# Patient Record
Sex: Female | Born: 1965 | Race: Black or African American | Hispanic: No | Marital: Single | Smoking: Never smoker
Health system: Southern US, Community
[De-identification: ages and names within clinical notes are randomized; demographics above are authoritative.]

## PROBLEM LIST (undated history)

## (undated) DIAGNOSIS — K589 Irritable bowel syndrome without diarrhea: Secondary | ICD-10-CM

## (undated) DIAGNOSIS — I10 Essential (primary) hypertension: Secondary | ICD-10-CM

## (undated) DIAGNOSIS — K219 Gastro-esophageal reflux disease without esophagitis: Secondary | ICD-10-CM

## (undated) DIAGNOSIS — I341 Nonrheumatic mitral (valve) prolapse: Secondary | ICD-10-CM

## (undated) DIAGNOSIS — J45909 Unspecified asthma, uncomplicated: Secondary | ICD-10-CM

## (undated) HISTORY — PX: APPENDECTOMY: SHX54

## (undated) HISTORY — PX: BREAST BIOPSY: SHX20

## (undated) HISTORY — PX: CHOLECYSTECTOMY: SHX55

## (undated) HISTORY — DX: Gastro-esophageal reflux disease without esophagitis: K21.9

## (undated) HISTORY — PX: BREAST SURGERY: SHX581

## (undated) HISTORY — PX: ABDOMINAL HYSTERECTOMY: SHX81

---

## 2000-12-11 HISTORY — PX: TONSILLECTOMY: SUR1361

## 2004-12-11 HISTORY — PX: REDUCTION MAMMAPLASTY: SUR839

## 2018-05-16 ENCOUNTER — Emergency Department (HOSPITAL_COMMUNITY)
Admission: EM | Admit: 2018-05-16 | Discharge: 2018-05-16 | Disposition: A | Discharge status: Home / Self Care | Payer: 59 | Attending: Emergency Medicine | Admitting: Emergency Medicine

## 2018-05-16 ENCOUNTER — Emergency Department (HOSPITAL_COMMUNITY): Payer: 59

## 2018-05-16 ENCOUNTER — Other Ambulatory Visit: Payer: Self-pay

## 2018-05-16 ENCOUNTER — Encounter (HOSPITAL_COMMUNITY): Payer: Self-pay | Admitting: Emergency Medicine

## 2018-05-16 DIAGNOSIS — I10 Essential (primary) hypertension: Secondary | ICD-10-CM | POA: Insufficient documentation

## 2018-05-16 DIAGNOSIS — M5412 Radiculopathy, cervical region: Secondary | ICD-10-CM | POA: Diagnosis not present

## 2018-05-16 DIAGNOSIS — M4802 Spinal stenosis, cervical region: Secondary | ICD-10-CM | POA: Insufficient documentation

## 2018-05-16 DIAGNOSIS — J45909 Unspecified asthma, uncomplicated: Secondary | ICD-10-CM | POA: Diagnosis not present

## 2018-05-16 DIAGNOSIS — M542 Cervicalgia: Secondary | ICD-10-CM | POA: Diagnosis present

## 2018-05-16 HISTORY — DX: Irritable bowel syndrome without diarrhea: K58.9

## 2018-05-16 HISTORY — DX: Essential (primary) hypertension: I10

## 2018-05-16 HISTORY — DX: Nonrheumatic mitral (valve) prolapse: I34.1

## 2018-05-16 HISTORY — DX: Unspecified asthma, uncomplicated: J45.909

## 2018-05-16 MED ORDER — METHOCARBAMOL 500 MG PO TABS: 500.0000 mg | ORAL_TABLET | Freq: Three times a day (TID) | ORAL | 0 refills | Status: AC

## 2018-05-16 MED ORDER — PREDNISONE 10 MG PO TABS: 40.0000 mg | ORAL_TABLET | Freq: Every day | ORAL | 0 refills | Status: AC

## 2018-05-16 NOTE — ED Provider Notes (Signed)
[facility] Provider Note   CSN: [id number] Arrival date & time: 05/16/18  1204     History   Chief Complaint Chief Complaint  Patient presents with  . Arm Injury    HPI Amanda [name] is a 52 y.o. female with h/o HTN and cervical radiculopathy and foraminal stenosis s/p discectomy and fusion (Dr. Yvone NeuPatton in [place]) 2017 here for gradual onset worsening right sided neck pain x 2 weeks, worsening over last 2 days. Described as sharp, shooting. Moderate in severity. Radiates to right trapezius, right shoulder and right 4th and 5th fingers tips. Associated with paresthesias and intermittent decreased sensation to 4th and 5th fingers only. Aggravated by palpation, right neck bend and rotation and letting her right hand hang to the side for a long period of time. Has tried OTC NSAIDs without relief.   No recent trauma/falls, overuse activities. She denies fevers, chills, rash, neck rigidity, headache, vision changes, unilateral weakness, IVDU. Recently relocated from [place].   HPI  Past Medical History:  Diagnosis Date  . Asthma   . Hypertension   . Irritable bowel   . Mitral valve prolapse     There are no active problems to display for this patient.   History reviewed. No pertinent surgical history.   OB History   None      Home Medications    Prior to Admission medications   Medication Sig Start Date End Date Taking? Authorizing Provider  methocarbamol (ROBAXIN) 500 MG tablet Take 1 tablet (500 mg total) by mouth 3 (three) times daily for 5 days. 05/16/18 05/21/18  Liberty HandyGibbons, Claudia J, PA-C  predniSONE (DELTASONE) 10 MG tablet Take 4 tablets (40 mg total) by mouth daily for 5 days. 05/16/18 05/21/18  Liberty HandyGibbons, Claudia J, PA-C    Family History No family history on file.  Social History Social History   Tobacco Use  . Smoking status: Not on file  Substance Use Topics  . Alcohol use: Not on file  . Drug use: Not on file      Allergies   Vancomycin   Review of Systems Review of Systems  Musculoskeletal: Positive for myalgias and neck pain.  Neurological: Positive for numbness.       Paresthesias   All other systems reviewed and are negative.    Physical Exam Updated Vital Signs BP (!) 121/98 (BP Location: Left Arm)   Pulse 73   Temp 98.4 F (36.9 C) (Oral)   Resp 16   LMP  (LMP Unknown)   SpO2 100%   Physical Exam  Constitutional: She is oriented to person, place, and time. She appears well-developed and well-nourished.  Non-toxic appearance.  HENT:  Head: Normocephalic.  Right Ear: External ear normal.  Left Ear: External ear normal.  Nose: Nose normal.  Atraumatic. No facial or scalp tenderness, asymmetry.   Eyes: Conjunctivae and EOM are normal.  Neck: Muscular tenderness present.  Able to touch chin to chest without rigidity or pain.   Cardiovascular: Normal rate and normal heart sounds.  Pulmonary/Chest: Effort normal and breath sounds normal.  Musculoskeletal: Normal range of motion.       Cervical back: She exhibits tenderness.       Back:  C-spine: no midline tenderness. Mild R sided muscular tenderness from occiput to top of trapezius. Full AROM but pain reproduced with right neck bend and rotation. Positive spurling's. Negative adson's. No overlaying rash. 2+ carotids. Trachea midline.  Right upper extremity: 5/5 strength with finger abduction, adduction,  flexion and extension, hand grip, wrist F/E, elbow F/E and shoulder F/E/Ab/Ad bilaterally.  Painless PROM of shoulders without pain. positive phalen's (R) with paresthesias to 4th and 5th fingers.   Thoracic spine: no midline tenderness or paraspinal muscle tenderness.   Neurological: She is alert and oriented to person, place, and time.  Subjective decreased sensation to right 4th and 5th fingers to sharp stimulus however 2 point discrimination intact bilaterally.   Skin: Skin is warm and dry. Capillary refill takes less  than 2 seconds.  No overlaying rash to neck, back or UE   Psychiatric: Her behavior is normal. Thought content normal.     ED Treatments / Results  Labs (all labs ordered are listed, but only abnormal results are displayed) Labs Reviewed - No data to display  EKG None  Radiology Dg Cervical Spine Complete  Result Date: 05/16/2018 CLINICAL DATA:  Acute neck pain radiating down RIGHT arm for several days. No known injury. Initial encounter. EXAM: CERVICAL SPINE - COMPLETE 4+ VIEW COMPARISON:  None. FINDINGS: Anterior/interbody fusion from C4-C7 identified. No acute fracture or subluxation noted. No prevertebral soft tissue swelling. Mild degenerative disc disease at C3-4 and C7-T1 identified. Mild multilevel facet arthropathy noted. Bony foraminal narrowing is identified on the RIGHT at C4-5 and on the LEFT at C6-7. No focal bony lesions are present. IMPRESSION: 1. No acute abnormality 2. Anterior/interbody fusion from C4-C7 with degenerative changes as described. Bony foraminal narrowing on the RIGHT at C4-5 and on the LEFT at C6-7. Electronically Signed   By: Harmon Pier M.D.   On: 05/16/2018 14:39    Procedures Procedures (including critical care time)  Medications Ordered in ED Medications - No data to display   Initial Impression / Assessment and Plan / ED Course  I have reviewed the triage vital signs and the nursing notes.  Pertinent labs & imaging results that were available during my care of the patient were reviewed by me and considered in my medical decision making (see chart for details).  Clinical Course as of May 17 1000  Thu May 16, 2018  1525 FINDINGS: Anterior/interbody fusion from C4-C7 identified.  No acute fracture or subluxation noted. No prevertebral soft tissue swelling.  Mild degenerative disc disease at C3-4 and C7-T1 identified.  Mild multilevel facet arthropathy noted.  Bony foraminal narrowing is identified on the RIGHT at C4-5 and on the LEFT  at C6-7.  No focal bony lesions are present.  DG Cervical Spine Complete [CG]    Clinical Course User Index [CG] Liberty Handy, PA-C   52 y.o. yo female with pertinent pmh of cervical radiculopathy and foraminal stenosis s/p fusion in 2017 presents with neck pain x 2 weeks. She has reproducible TTP to right posterior neck pain associated with paresthesias down to fingers on exam.  Positive Spurling's and phalen's.  No traumatic antecedent event.  No focal weakness to LUE.  No Lhermitte's phenomenon, no gait disturbances, weakness, muscle atrophy.  No recent fevers, chills, unexplained weight loss, immunosuppression, cancer or IVDU.  Low suspicion for dissection, stroke, meningitis, epidural abscess, bony fracture based on history and exam. Triage ordered imaging which was reviewed showing mild DDD and foraminal stenosis of cervical spine.  I suspect this is contributing to radicular symptoms.  Will discharge with oral analgesics, prednisone and muscle relaxers.  Advised patient to avoid aggravating activities until symptoms start to improve.  Educated patient on red flag symptoms that would warrant return to ED, patient verbalized understanding.  I have  provided pt with contact information for on call neurosurgery group to f/u within 1 week if symptoms not improving.    Final Clinical Impressions(s) / ED Diagnoses   Final diagnoses:  Foraminal stenosis of cervical region  Cervical radiculopathy    ED Discharge Orders        Ordered    predniSONE (DELTASONE) 10 MG tablet  Daily     05/16/18 1526    methocarbamol (ROBAXIN) 500 MG tablet  3 times daily     05/16/18 1526       Liberty Handy, [place] 05/17/18 1001    Lorre Nick, MD 05/18/18 1153

## 2018-05-16 NOTE — ED Triage Notes (Signed)
Patient complains of pain and tingling radiating from upper back down both arms x2 weeks. Patient has taken tylenol and aleve with some relief. Patient alert, oriented, and ambulating independently with steady gait.

## 2018-05-16 NOTE — ED Provider Notes (Signed)
Patient placed in Quick Look pathway, seen and evaluated   Chief Complaint: pain and tingling starting in back and going to the right arm  HPI:  Pt presents with 2 weeks of pain that starts in her neck and back and radiates down the right arm causing pain and tingling down into right arm, occasional pain and tingling on the left, she reports some subjective weakness and hypersensitivity, No known injury to neck or back, hx of previous neck surgery.  ROS: + back pain, neck pain, arm pain, tingling, - fevers, chest pain, SOB, redness or swelling  Physical Exam:   Gen: No distress  Neuro: Awake and Alert  Skin: Warm    Focused Exam: Strength 5/5 in bilateral UE, normal sensation, no erythema, or swelling. Symptoms reproduced with Spurling maneuver   Initiation of care has begun. The patient has been counseled on the process, plan, and necessity for staying for the completion/evaluation, and the remainder of the medical screening examination    Legrand RamsFord, Naleyah Ohlinger N, PA-C 05/16/18 1325    Eber HongMiller, Brian, MD 05/17/18 [phone]

## 2018-05-16 NOTE — Discharge Instructions (Addendum)
Your x-ray shows narrowing of the bones in your neck that are probably causing nerve inflammation.  This is called foraminal stenosis.  Initial treatment is symptomatic.  If pain is refractory you need to follow-up with neurosurgery.  We will treat your inflammation with the following medication regimen: Prednisone 40 mg daily x 5 days Methocarbamol (robaxin) 500 mg every 8 hours x 5 days Ibuprofen 600 mg + acetaminophen 1000 mg every 8 hours for the next 5 days Heating pad as needed Over the counter lidocaine patches (salonpas) can be helpful  Avoid any exacerbating activities for the next 48 hours.  After 48 hours, start doing light back range of motion exercises and walking to avoid worsening back stiffness.   Return for fevers, chills, abdominal pain, changes in bowel movement, urinary symptoms, groin numbness, loss of bladder or bowel control, numbness weakness or heaviness to your extremities, rash.

## 2018-06-20 ENCOUNTER — Other Ambulatory Visit: Payer: Self-pay | Admitting: Physician Assistant

## 2018-06-20 DIAGNOSIS — M5412 Radiculopathy, cervical region: Secondary | ICD-10-CM

## 2018-07-03 ENCOUNTER — Ambulatory Visit
Admission: RE | Admit: 2018-07-03 | Discharge: 2018-07-03 | Disposition: A | Discharge status: Home / Self Care | Payer: 59 | Source: Ambulatory Visit | Attending: Physician Assistant | Admitting: Physician Assistant

## 2018-07-03 DIAGNOSIS — M5412 Radiculopathy, cervical region: Secondary | ICD-10-CM

## 2018-07-03 MED ORDER — GADOBENATE DIMEGLUMINE 529 MG/ML IV SOLN
20.0000 mL | Freq: Once | INTRAVENOUS | Status: AC | PRN
  Administered 2018-07-03: 20 mL via INTRAVENOUS

## 2018-10-31 ENCOUNTER — Ambulatory Visit: Payer: 59 | Admitting: Family Medicine

## 2018-10-31 ENCOUNTER — Encounter: Payer: Self-pay | Admitting: Family Medicine

## 2018-10-31 ENCOUNTER — Other Ambulatory Visit: Payer: Self-pay | Admitting: Family Medicine

## 2018-10-31 VITALS — BP 110/88 | HR 96 | Temp 97.9°F | Ht 67.0 in | Wt 216.0 lb

## 2018-10-31 DIAGNOSIS — J4520 Mild intermittent asthma, uncomplicated: Secondary | ICD-10-CM

## 2018-10-31 DIAGNOSIS — K589 Irritable bowel syndrome without diarrhea: Secondary | ICD-10-CM

## 2018-10-31 DIAGNOSIS — M79605 Pain in left leg: Secondary | ICD-10-CM

## 2018-10-31 DIAGNOSIS — I10 Essential (primary) hypertension: Secondary | ICD-10-CM

## 2018-10-31 DIAGNOSIS — Z7689 Persons encountering health services in other specified circumstances: Secondary | ICD-10-CM

## 2018-10-31 DIAGNOSIS — K219 Gastro-esophageal reflux disease without esophagitis: Secondary | ICD-10-CM | POA: Diagnosis not present

## 2018-10-31 NOTE — Progress Notes (Signed)
Patient presents to clinic today to establish care.  SUBJECTIVE: PMH: Pt is a 52 yo female with pmh sig for HTN, GERD, IBS, and asthma.  Pt previously seen in [place] by Dr. Arthur Holms.  HTN: -taking lisinopril10-12.5 mg daily -walking for exercise  GERD: -taking prilosec  IBS: -may have taken Bentyl.  Pt cannot recall med, thinks started with an "O" or a "B" -has seen GI in the past  Asthma: -taking singulair -infrequent symptoms  LLE pain: -stepped down wrong while going down stairs -6 days ago -L lateral knee and calf pain -tried ice, heat, elevation, left over flexeril and motrin -improving but sore -denies calf or knee edema or erythema Allergies: Vancomycin--hives, red man syndrome, anaphylaxis after MRSA infection.  Past Medical History:  Diagnosis Date  . Asthma   . Hypertension   . Irritable bowel   . Mitral valve prolapse     History reviewed. No pertinent surgical history.  Current Outpatient Medications on File Prior to Visit  Medication Sig Dispense Refill  . lisinopril-hydrochlorothiazide (PRINZIDE,ZESTORETIC) 10-12.5 MG tablet Take 1 tablet by mouth daily.    . montelukast (SINGULAIR) 10 MG tablet Take by mouth at bedtime.    Marland Kitchen omeprazole (PRILOSEC) 20 MG capsule Take 20 mg by mouth daily.     No current facility-administered medications on file prior to visit.     Allergies  Allergen Reactions  . Vancomycin Hives and Rash    History reviewed. No pertinent family history.  Social History   Socioeconomic History  . Marital status: Single    Spouse name: Not on file  . Number of children: Not on file  . Years of education: Not on file  . Highest education level: Not on file  Occupational History  . Not on file  Social Needs  . Financial resource strain: Not on file  . Food insecurity:    Worry: Not on file    Inability: Not on file  . Transportation needs:    Medical: Not on file    Non-medical: Not on file  Tobacco  Use  . Smoking status: Not on file  Substance and Sexual Activity  . Alcohol use: Not on file  . Drug use: Not on file  . Sexual activity: Not on file  Lifestyle  . Physical activity:    Days per week: Not on file    Minutes per session: Not on file  . Stress: Not on file  Relationships  . Social connections:    Talks on phone: Not on file    Gets together: Not on file    Attends religious service: Not on file    Active member of club or organization: Not on file    Attends meetings of clubs or organizations: Not on file    Relationship status: Not on file  . Intimate partner violence:    Fear of current or ex partner: Not on file    Emotionally abused: Not on file    Physically abused: Not on file    Forced sexual activity: Not on file  Other Topics Concern  . Not on file  Social History Narrative  . Not on file    ROS General: Denies fever, chills, night sweats, changes in weight, changes in appetite HEENT: Denies headaches, ear pain, changes in vision, rhinorrhea, sore throat CV: Denies CP, palpitations, SOB, orthopnea Pulm: Denies SOB, cough, wheezing GI: Denies abdominal pain, nausea, vomiting, diarrhea, constipation  +reflux, h/o gerd GU: Denies dysuria,  hematuria, frequency, vaginal discharge Msk: Denies muscle cramps, joint pains  +: LLE pain Neuro: Denies weakness, numbness, tingling Skin: Denies rashes, bruising Psych: Denies depression, anxiety, hallucinations   BP 110/88 (BP Location: Left Arm, Patient Position: Sitting, Cuff Size: Normal)   Pulse 96   Temp 97.9 F (36.6 C) (Oral)   Ht 5\' 7"  (1.702 m)   Wt 216 lb (98 kg)   LMP  (LMP Unknown)   SpO2 96%   BMI 33.83 kg/m   Physical Exam Gen. Pleasant, well developed, well-nourished, in NAD HEENT - [place]/AT, PERRL, no scleral icterus, no nasal drainage, pharynx without erythema or exudate. Lungs: no use of accessory muscles, CTAB, no wheezes, rales or rhonchi Cardiovascular: RRR, No r/g/m, no peripheral  edema Musculoskeletal: Mild TTP of L lateral leg inferior to knee, no TTP in calf.  No deformities, moves all four extremities, no cyanosis or clubbing, normal tone Neuro:  A&Ox3, CN II-XII intact, normal gait  No results found for this or any previous visit (from the past 2160 hour(s)).  Assessment/Plan: Essential hypertension -controlled  -continue lisinopril-HCTZ 10-12.5 mg -continue lifestyle modifications  Gastroesophageal reflux disease, esophagitis presence not specified -continue prilosec -avoid foods known to cause problems  Irritable bowel syndrome, unspecified type -discussed low FODMAP diet -consider referral to GI  Mild intermittent asthma without complication -continue singulair  -consider albuterol prn  LLE pain -continue NSAIDs, elevated, ice  Encounter to establish care -We reviewed the PMH, PSH, FH, SH, Meds and Allergies. -We provided refills for any medications we will prescribe as needed. -We addressed current concerns per orders and patient instructions. -We have asked for records for pertinent exams, studies, vaccines and notes from previous providers. -We have advised patient to follow up per instructions below.  F/u prn  Abbe AmsterdamShannon Aleksis Jiggetts, MD

## 2018-10-31 NOTE — Telephone Encounter (Signed)
Dr. Salomon FickBanks, you seen this pt and established care.  She is asking for refills of her medications.  Please advise.

## 2018-10-31 NOTE — Patient Instructions (Signed)
Diet for Irritable Bowel Syndrome When you have irritable bowel syndrome (IBS), the foods you eat and your eating habits are very important. IBS may cause various symptoms, such as abdominal pain, constipation, or diarrhea. Choosing the right foods can help ease discomfort caused by these symptoms. Work with your health care provider and dietitian to find the best eating plan to help control your symptoms. What general guidelines do I need to follow?  Keep a food diary. This will help you identify foods that cause symptoms. Write down: ? What you eat and when. ? What symptoms you have. ? When symptoms occur in relation to your meals.  Avoid foods that cause symptoms. Talk with your dietitian about other ways to get the same nutrients that are in these foods.  Eat more foods that contain fiber. Take a fiber supplement if directed by your dietitian.  Eat your meals slowly, in a relaxed setting.  Aim to eat 5-6 small meals per day. Do not skip meals.  Drink enough fluids to keep your urine clear or pale yellow.  Ask your health care provider if you should take an over-the-counter probiotic during flare-ups to help restore healthy gut bacteria.  If you have cramping or diarrhea, try making your meals low in fat and high in carbohydrates. Examples of carbohydrates are pasta, rice, whole grain breads and cereals, fruits, and vegetables.  If dairy products cause your symptoms to flare up, try eating less of them. You might be able to handle yogurt better than other dairy products because it contains bacteria that help with digestion. What foods are not recommended? The following are some foods and drinks that may worsen your symptoms:  Fatty foods, such as [place] fries.  Milk products, such as cheese or ice cream.  Chocolate.  Alcohol.  Products with caffeine, such as coffee.  Carbonated drinks, such as soda.  The items listed above may not be a complete list of foods and beverages to  avoid. Contact your dietitian for more information. What foods are good sources of fiber? Your health care provider or dietitian may recommend that you eat more foods that contain fiber. Fiber can help reduce constipation and other IBS symptoms. Add foods with fiber to your diet a little at a time so that your body can get used to them. Too much fiber at once might cause gas and swelling of your abdomen. The following are some foods that are good sources of fiber:  Apples.  Peaches.  Pears.  Berries.  Figs.  Broccoli (raw).  Cabbage.  Carrots.  Raw peas.  Kidney beans.  Lima beans.  Whole grain bread.  Whole grain cereal.  Where to find more information: [employer] for Functional Gastrointestinal Disorders: www.iffgd.[employer] of Diabetes and Digestive and Kidney Diseases: http://norris-lawson.com/.aspx This information is not intended to replace advice given to you by your health care provider. Make sure you discuss any questions you have with your health care provider. Document Released: 02/17/2004 Document Revised: 05/04/2016 Document Reviewed: 02/27/2014 Elsevier Interactive Patient Education  2018 [employer].  Food Choices for Gastroesophageal Reflux Disease, Adult When you have gastroesophageal reflux disease (GERD), the foods you eat and your eating habits are very important. Choosing the right foods can help ease your discomfort. What guidelines do I need to follow?  Choose fruits, vegetables, whole grains, and low-fat dairy products.  Choose low-fat meat, fish, and poultry.  Limit fats such as oils, salad dressings, butter, nuts, and avocado.  Keep a food  diary. This helps you identify foods that cause symptoms.  Avoid foods that cause symptoms. These may be different for everyone.  Eat small meals often instead of 3 large meals a day.  Eat your meals slowly, in a  place where you are relaxed.  Limit fried foods.  Cook foods using methods other than frying.  Avoid drinking alcohol.  Avoid drinking large amounts of liquids with your meals.  Avoid bending over or lying down until 2-3 hours after eating. What foods are not recommended? These are some foods and drinks that may make your symptoms worse: Vegetables Tomatoes. Tomato juice. Tomato and spaghetti sauce. Chili peppers. Onion and garlic. Horseradish. Fruits Oranges, grapefruit, and lemon (fruit and juice). Meats High-fat meats, fish, and poultry. This includes hot dogs, ribs, ham, sausage, salami, and bacon. Dairy Whole milk and chocolate milk. Sour cream. Cream. Butter. Ice cream. Cream cheese. Drinks Coffee and tea. Bubbly (carbonated) drinks or energy drinks. Condiments Hot sauce. Barbecue sauce. Sweets/Desserts Chocolate and cocoa. Donuts. Peppermint and spearmint. Fats and Oils High-fat foods. This includes [place] fries and potato chips. Other Vinegar. Strong spices. This includes black pepper, white pepper, red pepper, cayenne, curry powder, cloves, ginger, and chili powder. The items listed above may not be a complete list of foods and drinks to avoid. Contact your dietitian for more information. This information is not intended to replace advice given to you by your health care provider. Make sure you discuss any questions you have with your health care provider. Document Released: 05/28/2012 Document Revised: 05/04/2016 Document Reviewed: 10/01/2013 Elsevier Interactive Patient Education  2017 [employer] Inc.  Managing Your Hypertension Hypertension is commonly called high blood pressure. This is when the force of your blood pressing against the walls of your arteries is too strong. Arteries are blood vessels that carry blood from your heart throughout your body. Hypertension forces the heart to work harder to pump blood, and may cause the arteries to become narrow or stiff.  Having untreated or uncontrolled hypertension can cause heart attack, stroke, kidney disease, and other problems. What are blood pressure readings? A blood pressure reading consists of a higher number over a lower number. Ideally, your blood pressure should be below 120/80. The first ("top") number is called the systolic pressure. It is a measure of the pressure in your arteries as your heart beats. The second ("bottom") number is called the diastolic pressure. It is a measure of the pressure in your arteries as the heart relaxes. What does my blood pressure reading mean? Blood pressure is classified into four stages. Based on your blood pressure reading, your health care provider may use the following stages to determine what type of treatment you need, if any. Systolic pressure and diastolic pressure are measured in a unit called mm Hg. Normal  Systolic pressure: below 120.  Diastolic pressure: below 80. Elevated  Systolic pressure: 120-129.  Diastolic pressure: below 80. Hypertension stage 1  Systolic pressure: 130-139.  Diastolic pressure: 80-89. Hypertension stage 2  Systolic pressure: 140 or above.  Diastolic pressure: 90 or above. What health risks are associated with hypertension? Managing your hypertension is an important responsibility. Uncontrolled hypertension can lead to:  A heart attack.  A stroke.  A weakened blood vessel (aneurysm).  Heart failure.  Kidney damage.  Eye damage.  Metabolic syndrome.  Memory and concentration problems.  What changes can I make to manage my hypertension? Hypertension can be managed by making lifestyle changes and possibly by taking medicines. Your health care  provider will help you make a plan to bring your blood pressure within a normal range. Eating and drinking  Eat a diet that is high in fiber and potassium, and low in salt (sodium), added sugar, and fat. An example eating plan is called the DASH (Dietary Approaches to  Stop Hypertension) diet. To eat this way: ? Eat plenty of fresh fruits and vegetables. Try to fill half of your plate at each meal with fruits and vegetables. ? Eat whole grains, such as whole wheat pasta, brown rice, or whole grain bread. Fill about one quarter of your plate with whole grains. ? Eat low-fat diary products. ? Avoid fatty cuts of meat, processed or cured meats, and poultry with skin. Fill about one quarter of your plate with lean proteins such as fish, chicken without skin, beans, eggs, and tofu. ? Avoid premade and processed foods. These tend to be higher in sodium, added sugar, and fat.  Reduce your daily sodium intake. Most people with hypertension should eat less than 1,500 mg of sodium a day.  Limit alcohol intake to no more than 1 drink a day for nonpregnant women and 2 drinks a day for men. One drink equals 12 oz of beer, 5 oz of wine, or 1 oz of hard liquor. Lifestyle  Work with your health care provider to maintain a healthy body weight, or to lose weight. Ask what an ideal weight is for you.  Get at least 30 minutes of exercise that causes your heart to beat faster (aerobic exercise) most days of the week. Activities may include walking, swimming, or biking.  Include exercise to strengthen your muscles (resistance exercise), such as weight lifting, as part of your weekly exercise routine. Try to do these types of exercises for 30 minutes at least 3 days a week.  Do not use any products that contain nicotine or tobacco, such as cigarettes and e-cigarettes. If you need help quitting, ask your health care provider.  Control any long-term (chronic) conditions you have, such as high cholesterol or diabetes. Monitoring  Monitor your blood pressure at home as told by your health care provider. Your personal target blood pressure may vary depending on your medical conditions, your age, and other factors.  Have your blood pressure checked regularly, as often as told by your  health care provider. Working with your health care provider  Review all the medicines you take with your health care provider because there may be side effects or interactions.  Talk with your health care provider about your diet, exercise habits, and other lifestyle factors that may be contributing to hypertension.  Visit your health care provider regularly. Your health care provider can help you create and adjust your plan for managing hypertension. Will I need medicine to control my blood pressure? Your health care provider may prescribe medicine if lifestyle changes are not enough to get your blood pressure under control, and if:  Your systolic blood pressure is 130 or higher.  Your diastolic blood pressure is 80 or higher.  Take medicines only as told by your health care provider. Follow the directions carefully. Blood pressure medicines must be taken as prescribed. The medicine does not work as well when you skip doses. Skipping doses also puts you at risk for problems. Contact a health care provider if:  You think you are having a reaction to medicines you have taken.  You have repeated (recurrent) headaches.  You feel dizzy.  You have swelling in your ankles.  You  have trouble with your vision. Get help right away if:  You develop a severe headache or confusion.  You have unusual weakness or numbness, or you feel faint.  You have severe pain in your chest or abdomen.  You vomit repeatedly.  You have trouble breathing. Summary  Hypertension is when the force of blood pumping through your arteries is too strong. If this condition is not controlled, it may put you at risk for serious complications.  Your personal target blood pressure may vary depending on your medical conditions, your age, and other factors. For most people, a normal blood pressure is less than 120/80.  Hypertension is managed by lifestyle changes, medicines, or both. Lifestyle changes include  weight loss, eating a healthy, low-sodium diet, exercising more, and limiting alcohol. This information is not intended to replace advice given to you by your health care provider. Make sure you discuss any questions you have with your health care provider. Document Released: 08/21/2012 Document Revised: 10/25/2016 Document Reviewed: 10/25/2016 Elsevier Interactive Patient Education  [employer].

## 2018-11-01 ENCOUNTER — Encounter: Payer: Self-pay | Admitting: Family Medicine

## 2018-11-01 MED ORDER — OMEPRAZOLE 20 MG PO CPDR: 20.0000 mg | DELAYED_RELEASE_CAPSULE | Freq: Every day | ORAL | 3 refills | Status: DC

## 2018-11-01 MED ORDER — MONTELUKAST SODIUM 10 MG PO TABS: 10.0000 mg | ORAL_TABLET | Freq: Every day | ORAL | 3 refills | Status: DC

## 2018-11-01 MED ORDER — LISINOPRIL-HYDROCHLOROTHIAZIDE 10-12.5 MG PO TABS: 1.0000 | ORAL_TABLET | Freq: Every day | ORAL | 3 refills | Status: DC

## 2018-11-03 ENCOUNTER — Encounter: Payer: Self-pay | Admitting: Family Medicine

## 2018-11-03 DIAGNOSIS — K589 Irritable bowel syndrome without diarrhea: Secondary | ICD-10-CM | POA: Insufficient documentation

## 2018-11-03 DIAGNOSIS — J4520 Mild intermittent asthma, uncomplicated: Secondary | ICD-10-CM | POA: Insufficient documentation

## 2018-11-03 DIAGNOSIS — I10 Essential (primary) hypertension: Secondary | ICD-10-CM | POA: Insufficient documentation

## 2018-11-03 DIAGNOSIS — K219 Gastro-esophageal reflux disease without esophagitis: Secondary | ICD-10-CM | POA: Insufficient documentation

## 2019-02-03 ENCOUNTER — Encounter: Payer: Self-pay | Admitting: Family Medicine

## 2019-02-03 ENCOUNTER — Ambulatory Visit (INDEPENDENT_AMBULATORY_CARE_PROVIDER_SITE_OTHER): Payer: 59 | Admitting: Family Medicine

## 2019-02-03 VITALS — BP 110/78 | HR 82 | Temp 97.9°F | Wt 222.0 lb

## 2019-02-03 DIAGNOSIS — I10 Essential (primary) hypertension: Secondary | ICD-10-CM

## 2019-02-03 DIAGNOSIS — Z0000 Encounter for general adult medical examination without abnormal findings: Secondary | ICD-10-CM

## 2019-02-03 DIAGNOSIS — J302 Other seasonal allergic rhinitis: Secondary | ICD-10-CM

## 2019-02-03 DIAGNOSIS — Z131 Encounter for screening for diabetes mellitus: Secondary | ICD-10-CM | POA: Diagnosis not present

## 2019-02-03 DIAGNOSIS — Z13220 Encounter for screening for lipoid disorders: Secondary | ICD-10-CM | POA: Diagnosis not present

## 2019-02-03 DIAGNOSIS — Z1211 Encounter for screening for malignant neoplasm of colon: Secondary | ICD-10-CM

## 2019-02-03 LAB — LIPID PANEL
CHOL/HDL RATIO: 4
Cholesterol: 206 mg/dL — ABNORMAL HIGH (ref 0–200)
HDL: 47.3 mg/dL (ref >39.00)
LDL Cholesterol: 133 mg/dL — ABNORMAL HIGH (ref 0–99)
NONHDL: 158.64
Triglycerides: 127 mg/dL (ref 0.0–149.0)
VLDL: 25.4 mg/dL (ref 0.0–40.0)

## 2019-02-03 LAB — BASIC METABOLIC PANEL
BUN: 16 mg/dL (ref 6–23)
CALCIUM: 9.8 mg/dL (ref 8.4–10.5)
CO2: 28 mEq/L (ref 19–32)
CREATININE: 1.05 mg/dL (ref 0.40–1.20)
Chloride: 103 mEq/L (ref 96–112)
GFR: 66.37 mL/min (ref >60.00)
GLUCOSE: 128 mg/dL — AB (ref 70–99)
POTASSIUM: 4 meq/L (ref 3.5–5.1)
Sodium: 140 mEq/L (ref 135–145)

## 2019-02-03 LAB — CBC WITH DIFFERENTIAL/PLATELET
BASOS PCT: 1.1 % (ref 0.0–3.0)
Basophils Absolute: 0.1 10*3/uL (ref 0.0–0.1)
EOS PCT: 4.9 % (ref 0.0–5.0)
Eosinophils Absolute: 0.3 10*3/uL (ref 0.0–0.7)
HEMATOCRIT: 47.1 % — AB (ref 36.0–46.0)
HEMOGLOBIN: 15.4 g/dL — AB (ref 12.0–15.0)
LYMPHS PCT: 45.5 % (ref 12.0–46.0)
Lymphs Abs: 2.3 10*3/uL (ref 0.7–4.0)
MCHC: 32.7 g/dL (ref 30.0–36.0)
MCV: 84.4 fl (ref 78.0–100.0)
MONOS PCT: 5.3 % (ref 3.0–12.0)
Monocytes Absolute: 0.3 10*3/uL (ref 0.1–1.0)
Neutro Abs: 2.2 10*3/uL (ref 1.4–7.7)
Neutrophils Relative %: 43.2 % (ref 43.0–77.0)
Platelets: 247 10*3/uL (ref 150.0–400.0)
RBC: 5.58 Mil/uL — ABNORMAL HIGH (ref 3.87–5.11)
RDW: 15.5 % (ref 11.5–15.5)
WBC: 5.1 10*3/uL (ref 4.0–10.5)

## 2019-02-03 LAB — HEMOGLOBIN A1C: Hgb A1c MFr Bld: 6.7 % — ABNORMAL HIGH (ref 4.6–6.5)

## 2019-02-03 MED ORDER — ALBUTEROL SULFATE HFA 108 (90 BASE) MCG/ACT IN AERS: 2.0000 | INHALATION_SPRAY | Freq: Four times a day (QID) | RESPIRATORY_TRACT | 5 refills | Status: DC | PRN

## 2019-02-03 MED ORDER — FLUTICASONE PROPIONATE 50 MCG/ACT NA SUSP: 1.0000 | Freq: Every day | NASAL | 1 refills | Status: DC

## 2019-02-03 NOTE — Addendum Note (Signed)
Addended by: Abbe Amsterdam R on: 02/03/2019 10:53 AM   Modules accepted: Orders

## 2019-02-03 NOTE — Patient Instructions (Signed)
Preventive Care 40-64 Years, Female Preventive care refers to lifestyle choices and visits with your health care provider that can promote health and wellness. What does preventive care include?   A yearly physical exam. This is also called an annual well check.  Dental exams once or twice a year.  Routine eye exams. Ask your health care provider how often you should have your eyes checked.  Personal lifestyle choices, including: ? Daily care of your teeth and gums. ? Regular physical activity. ? Eating a healthy diet. ? Avoiding tobacco and drug use. ? Limiting alcohol use. ? Practicing safe sex. ? Taking low-dose aspirin daily starting at age 50. ? Taking vitamin and mineral supplements as recommended by your health care provider. What happens during an annual well check? The services and screenings done by your health care provider during your annual well check will depend on your age, overall health, lifestyle risk factors, and family history of disease. Counseling Your health care provider may ask you questions about your:  Alcohol use.  Tobacco use.  Drug use.  Emotional well-being.  Home and relationship well-being.  Sexual activity.  Eating habits.  Work and work environment.  Method of birth control.  Menstrual cycle.  Pregnancy history. Screening You may have the following tests or measurements:  Height, weight, and BMI.  Blood pressure.  Lipid and cholesterol levels. These may be checked every 5 years, or more frequently if you are over 50 years old.  Skin check.  Lung cancer screening. You may have this screening every year starting at age 55 if you have a 30-pack-year history of smoking and currently smoke or have quit within the past 15 years.  Colorectal cancer screening. All adults should have this screening starting at age 50 and continuing until age 75. Your health care provider may recommend screening at age 45. You will have tests every  1-10 years, depending on your results and the type of screening test. People at increased risk should start screening at an earlier age. Screening tests may include: ? Guaiac-based fecal occult blood testing. ? Fecal immunochemical test (FIT). ? Stool DNA test. ? Virtual colonoscopy. ? Sigmoidoscopy. During this test, a flexible tube with a tiny camera (sigmoidoscope) is used to examine your rectum and lower colon. The sigmoidoscope is inserted through your anus into your rectum and lower colon. ? Colonoscopy. During this test, a long, thin, flexible tube with a tiny camera (colonoscope) is used to examine your entire colon and rectum.  Hepatitis C blood test.  Hepatitis B blood test.  Sexually transmitted disease (STD) testing.  Diabetes screening. This is done by checking your blood sugar (glucose) after you have not eaten for a while (fasting). You may have this done every 1-3 years.  Mammogram. This may be done every 1-2 years. Talk to your health care provider about when you should start having regular mammograms. This may depend on whether you have a family history of breast cancer.  BRCA-related cancer screening. This may be done if you have a family history of breast, ovarian, tubal, or peritoneal cancers.  Pelvic exam and Pap test. This may be done every 3 years starting at age 21. Starting at age 30, this may be done every 5 years if you have a Pap test in combination with an HPV test.  Bone density scan. This is done to screen for osteoporosis. You may have this scan if you are at high risk for osteoporosis. Discuss your test results, treatment options,   and if necessary, the need for more tests with your health care provider. Vaccines Your health care provider may recommend certain vaccines, such as:  Influenza vaccine. This is recommended every year.  Tetanus, diphtheria, and acellular pertussis (Tdap, Td) vaccine. You may need a Td booster every 10 years.  Varicella  vaccine. You may need this if you have not been vaccinated.  Zoster vaccine. You may need this after age 79.  Measles, mumps, and rubella (MMR) vaccine. You may need at least one dose of MMR if you were born in 1957 or later. You may also need a second dose.  Pneumococcal 13-valent conjugate (PCV13) vaccine. You may need this if you have certain conditions and were not previously vaccinated.  Pneumococcal polysaccharide (PPSV23) vaccine. You may need one or two doses if you smoke cigarettes or if you have certain conditions.  Meningococcal vaccine. You may need this if you have certain conditions.  Hepatitis A vaccine. You may need this if you have certain conditions or if you travel or work in places where you may be exposed to hepatitis A.  Hepatitis B vaccine. You may need this if you have certain conditions or if you travel or work in places where you may be exposed to hepatitis B.  Haemophilus influenzae type b (Hib) vaccine. You may need this if you have certain conditions. Talk to your health care provider about which screenings and vaccines you need and how often you need them. This information is not intended to replace advice given to you by your health care provider. Make sure you discuss any questions you have with your health care provider. Document Released: 12/24/2015 Document Revised: 01/17/2018 Document Reviewed: 09/28/2015 Elsevier Interactive Patient Education  2019 [place] [facility]  Colorectal cancer screening is a group of tests that are used to check for colorectal cancer before symptoms develop. Colorectal refers to the colon and rectum. The colon and rectum are located at the end of the digestive tract and carry bowel movements out of the body. Who should have screening? All adults starting at age 2 until age 13 should have screening. Your health care provider may recommend screening at age 1. You will have tests every 1-10 years,  depending on your results and the type of screening test. You may have screening tests starting at an earlier age, or more frequently than other people, if you have any of the following risk factors:  A personal or family history of colorectal cancer or abnormal growths (polyps).  Inflammatory bowel disease, such as ulcerative colitis or Crohn's disease.  A history of having radiation treatment to the abdomen or pelvic area for cancer.  Colorectal cancer symptoms, such as changes in bowel habits or blood in your stool.  A type of colon cancer syndrome that is passed from parent to child (hereditary), such as: ? Lynch syndrome. ? Familial adenomatous polyposis. ? Turcot syndrome. ? Peutz-Jeghers syndrome. Screening recommendations for adults who are 50-62 years old vary depending on health. How is screening done? There are several types of colorectal screening tests. You may have one or more of the following:  Guaiac-based fecal occult blood testing. For this test, a stool (feces) sample is checked for hidden (occult) blood, which could be a sign of colorectal cancer.  Fecal immunochemical test (FIT). For this test, a stool sample is checked for blood, which could be a sign of colorectal cancer.  Stool DNA test. For this test, a stool sample is checked for  blood and changes in DNA that could lead to colorectal cancer.  Sigmoidoscopy. During this test, a thin, flexible tube with a camera on the end (sigmoidoscope) is used to examine the rectum and the lower colon.  Colonoscopy. During this test, a long, flexible tube with a camera on the end (colonoscope) is used to examine the entire colon and rectum. With a colonoscopy, it is possible to take a sample of tissue (biopsy) and remove small polyps during the test.  Virtual colonoscopy. Instead of a colonoscope, this type of colonoscopy uses X-rays (CT scan) and computers to produce images of the colon and rectum. What are the benefits of  screening? Screening reduces your risk for colorectal cancer and can help identify cancer at an early stage, when the cancer can be removed or treated more easily. It is common for polyps to form in the lining of the colon, especially as you age. These polyps may be cancerous or become cancerous over time. Screening can identify these polyps. What are the risks of screening? Each screening test may have different risks.  Stool sample tests have fewer risks than other types of screening tests. However, you may need more tests to confirm results from a stool sample test.  Screening tests that involve X-rays expose you to low levels of radiation, which may slightly increase your cancer risk. The benefit of detecting cancer outweighs the slight increase in risk.  Screening tests such as sigmoidoscopy and colonoscopy may place you at risk for bleeding, intestinal damage, infection, or a reaction to medicines given during the exam. Talk with your health care provider to understand your risk for colorectal cancer and to make a screening plan that is right for you. Questions to ask your health care provider  When should I start colorectal cancer screening?  What is my risk for colorectal cancer?  How often do I need screening?  Which screening tests do I need?  How do I get my test results?  What do my results mean? Where to find more information Learn more about colorectal cancer screening from:  [employer]: www.cancer.org  [employer]: www.cancer.gov Summary  Colorectal cancer screening is a group of tests used to check for colorectal cancer before symptoms develop.  Screening reduces your risk for colorectal cancer and can help identify cancer at an early stage, when the cancer can be removed or treated more easily.  All adults starting at age 39 until age 28 should have screening. Your health care provider may recommend screening at age 52.  You  may have screening tests starting at an earlier age, or more frequently than other people, if you have certain risk factors.  Talk with your health care provider to understand your risk for colorectal cancer and to make a screening plan that is right for you. This information is not intended to replace advice given to you by your health care provider. Make sure you discuss any questions you have with your health care provider. Document Released: 05/17/2010 Document Revised: 11/09/2017 Document Reviewed: 08/29/2017 Elsevier Interactive Patient Education  2019 [employer].

## 2019-02-03 NOTE — Progress Notes (Signed)
Subjective:     Amanda [name] is a 53 y.o. female and is here for a comprehensive physical exam. The patient reports no problems.  Pt states she plans to start working out as she has gained some weight.  Patient is eating healthy overall but notes she is not moving as much as she used to.  Allergies: -Taking Singulair daily.  May take Aleve sinus if symptoms continue despite Singulair use.  Social History   Socioeconomic History  . Marital status: Single    Spouse name: Not on file  . Number of children: Not on file  . Years of education: Not on file  . Highest education level: Not on file  Occupational History  . Not on file  Social Needs  . Financial resource strain: Not on file  . Food insecurity:    Worry: Not on file    Inability: Not on file  . Transportation needs:    Medical: Not on file    Non-medical: Not on file  Tobacco Use  . Smoking status: Never Smoker  . Smokeless tobacco: Never Used  Substance and Sexual Activity  . Alcohol use: Not Currently  . Drug use: Never  . Sexual activity: Never  Lifestyle  . Physical activity:    Days per week: Not on file    Minutes per session: Not on file  . Stress: Not on file  Relationships  . Social connections:    Talks on phone: Not on file    Gets together: Not on file    Attends religious service: Not on file    Active member of club or organization: Not on file    Attends meetings of clubs or organizations: Not on file    Relationship status: Not on file  . Intimate partner violence:    Fear of current or ex partner: Not on file    Emotionally abused: Not on file    Physically abused: Not on file    Forced sexual activity: Not on file  Other Topics Concern  . Not on file  Social History Narrative  . Not on file   Health Maintenance  Topic Date Due  . HIV Screening  03/21/1981  . MAMMOGRAM  03/21/2016  . COLONOSCOPY  03/21/2016  . PAP SMEAR-Modifier  11/08/2020  . TETANUS/TDAP  11/05/2024  .  INFLUENZA VACCINE  Completed    The following portions of the patient's history were reviewed and updated as appropriate: allergies, current medications, past family history, past medical history, past social history, past surgical history and problem list.  Review of Systems Pertinent items noted in HPI and remainder of comprehensive ROS otherwise negative.   Objective:    BP 110/78 (BP Location: Left Arm, Patient Position: Sitting, Cuff Size: Large)   Pulse 82   Temp 97.9 F (36.6 C) (Oral)   Wt 222 lb (100.7 kg)   LMP  (LMP Unknown)   SpO2 97%   BMI 34.77 kg/m  General appearance: alert, cooperative, appears stated age and no distress Head: Normocephalic, without obvious abnormality, atraumatic Eyes: conjunctivae/corneas clear. PERRL, EOM's intact. Fundi benign. Ears: normal TM's and external ear canals both ears Nose: Nares normal. Septum midline. Mucosa normal. No drainage or sinus tenderness. Throat: lips, mucosa, and tongue normal; teeth and gums normal Neck: no adenopathy, no carotid bruit, no JVD, supple, symmetrical, trachea midline and thyroid not enlarged, symmetric, no tenderness/mass/nodules Lungs: clear to auscultation bilaterally Heart: regular rate and rhythm, S1, S2 normal, no murmur, click, rub  or gallop Abdomen: soft, non-tender; bowel sounds normal; no masses,  no organomegaly Extremities: extremities normal, atraumatic, no cyanosis or edema Pulses: 2+ and symmetric Skin: Skin color, texture, turgor normal. No rashes or lesions  B/l shins with 1.5 cm area of edema, nonfluctuant, no erythema or TTP.  Slight firmness to areas.  No blood vessels in area. Lymph nodes: Cervical, supraclavicular, and axillary nodes normal. Neurologic: Alert and oriented X 3, normal strength and tone. Normal symmetric reflexes. Normal coordination and gait    Assessment:    Healthy female exam.      Plan:     Anticipatory guidance given including wearing seatbelts, smoke  detectors in the home, increasing physical activity, increasing p.o. intake of water and vegetables. -will obtain labs -pap up to date, pt s/p total hysterectomy -given handout -pt to schedule mammogram.  Given handout -Next CPE in 1 year See After Visit Summary for Counseling Recommendations   Essential hypertension -Continue lisinopril-hydrochlorothiazide -We will obtain BMP -Discussed lifestyle modifications  Seasonal allergies -Continue Singulair -Discussed trying Flonase nasal spray or OTC allergy medication such as Claritin, Zyrtec, Allegra. -Rx sent in for Flonase  Follow-up PRN in the next few months  Abbe Amsterdam, MD

## 2019-02-04 ENCOUNTER — Other Ambulatory Visit: Payer: Self-pay | Admitting: Family Medicine

## 2019-02-04 DIAGNOSIS — Z1231 Encounter for screening mammogram for malignant neoplasm of breast: Secondary | ICD-10-CM

## 2019-03-07 ENCOUNTER — Ambulatory Visit: Payer: 59

## 2019-04-09 ENCOUNTER — Ambulatory Visit: Payer: 59

## 2019-05-06 ENCOUNTER — Other Ambulatory Visit: Payer: Self-pay

## 2019-05-06 MED ORDER — FLUTICASONE PROPIONATE 50 MCG/ACT NA SUSP: 1.0000 | Freq: Every day | NASAL | 1 refills | Status: DC

## 2019-05-21 ENCOUNTER — Other Ambulatory Visit: Payer: Self-pay

## 2019-05-21 ENCOUNTER — Ambulatory Visit
Admission: RE | Admit: 2019-05-21 | Discharge: 2019-05-21 | Disposition: A | Discharge status: Home / Self Care | Payer: 59 | Source: Ambulatory Visit | Attending: Family Medicine | Admitting: Family Medicine

## 2019-05-21 DIAGNOSIS — Z1231 Encounter for screening mammogram for malignant neoplasm of breast: Secondary | ICD-10-CM

## 2019-05-25 ENCOUNTER — Other Ambulatory Visit: Payer: Self-pay | Admitting: Family Medicine

## 2019-06-21 ENCOUNTER — Other Ambulatory Visit: Payer: Self-pay | Admitting: Family Medicine

## 2019-07-07 ENCOUNTER — Other Ambulatory Visit: Payer: Self-pay | Admitting: Family Medicine

## 2019-08-14 ENCOUNTER — Other Ambulatory Visit: Payer: Self-pay | Admitting: Family Medicine

## 2019-10-03 ENCOUNTER — Telehealth (INDEPENDENT_AMBULATORY_CARE_PROVIDER_SITE_OTHER): Payer: 59 | Admitting: Family Medicine

## 2019-10-03 ENCOUNTER — Other Ambulatory Visit: Payer: Self-pay

## 2019-10-03 ENCOUNTER — Ambulatory Visit: Payer: 59 | Admitting: Family Medicine

## 2019-10-03 DIAGNOSIS — E119 Type 2 diabetes mellitus without complications: Secondary | ICD-10-CM

## 2019-10-03 DIAGNOSIS — E782 Mixed hyperlipidemia: Secondary | ICD-10-CM

## 2019-10-03 DIAGNOSIS — R635 Abnormal weight gain: Secondary | ICD-10-CM

## 2019-10-03 DIAGNOSIS — I10 Essential (primary) hypertension: Secondary | ICD-10-CM | POA: Diagnosis not present

## 2019-10-03 DIAGNOSIS — K219 Gastro-esophageal reflux disease without esophagitis: Secondary | ICD-10-CM

## 2019-10-03 DIAGNOSIS — K589 Irritable bowel syndrome without diarrhea: Secondary | ICD-10-CM

## 2019-10-03 DIAGNOSIS — J4520 Mild intermittent asthma, uncomplicated: Secondary | ICD-10-CM

## 2019-10-03 MED ORDER — FLUTICASONE PROPIONATE 50 MCG/ACT NA SUSP: 1.0000 | Freq: Every day | NASAL | 1 refills | Status: AC

## 2019-10-03 MED ORDER — OMEPRAZOLE 20 MG PO CPDR: 20.0000 mg | DELAYED_RELEASE_CAPSULE | Freq: Every day | ORAL | 3 refills | Status: AC

## 2019-10-03 MED ORDER — MONTELUKAST SODIUM 10 MG PO TABS: 10.0000 mg | ORAL_TABLET | Freq: Every day | ORAL | 3 refills | Status: AC

## 2019-10-03 MED ORDER — LISINOPRIL-HYDROCHLOROTHIAZIDE 10-12.5 MG PO TABS: 1.0000 | ORAL_TABLET | Freq: Every day | ORAL | 3 refills | Status: AC

## 2019-10-03 NOTE — Progress Notes (Signed)
Virtual Visit via Video Note  I connected with Amanda Amanda [name] on 10/03/19 at  2:30 PM EDT by a video enabled telemedicine application 2/2 COVID-19 pandemic and verified that I am speaking with the correct person using two identifiers.  Location patient: home Location provider:work Amanda [name] home office Persons participating in the virtual visit: patient, provider  I discussed the limitations of evaluation and management by telemedicine and the availability of in person appointments. The patient expressed understanding and agreed to proceed.   HPI: Pt is a 53 yo with pmh hx sig for IBS, asthma, seen for f/u on labs from Feb 2020.  Pt's hgb A1C was 6.7% and cholesterol elevated.  She has cut out fried foods, beef, and pork. Trying to eat less bread and eat more salads.  Taking a MVI an vit D with K-3 from "Amanda Amanda [name]" a vitamin/whole foods person in [place]. Drinking more water, having to get up at night.  Had to stop walking when the weather got warm 2/2 triggering asthma.  Pt plans to restart.  Pt using a fitbit to help her move more during the day.  Pt states it has been hard to lose weight.    IBS-D: -notes diarrhea after eating first meal -taking a probiotic which seems to help -stopped taking Bentyl  HTN/ h/o MVP: -taking lisinopril-hctz 10-12.5 mg dialy -states bp controllled -noticed a decrease in bp after drinking 3 cups of hibiscus herbal tea/day -has since cut down on the tea as it was causing HAs. -Trying to move more  ROS: See pertinent positives and negatives per HPI.  Past Medical History:  Diagnosis Date  . Asthma   . GERD (gastroesophageal reflux disease)   . Hypertension   . Irritable bowel   . Mitral valve prolapse     Past Surgical History:  Procedure Laterality Date  . ABDOMINAL HYSTERECTOMY    . APPENDECTOMY    . BREAST BIOPSY Right   . BREAST SURGERY    . CHOLECYSTECTOMY    . REDUCTION MAMMAPLASTY Bilateral 2006  . TONSILLECTOMY  2002    Family History   Problem Relation Age of Onset  . Arthritis Mother   . Asthma Mother   . Diabetes Mother   . Heart attack Mother   . Hypertension Mother   . Kidney disease Mother   . Hyperlipidemia Brother   . Hypertension Brother      Current Outpatient Medications:  .  fluticasone (FLONASE) 50 MCG/ACT nasal spray, PLACE 1 SPRAY INTO BOTH NOSTRILS DAILY., Disp: 48 mL, Rfl: 1 .  lisinopril-hydrochlorothiazide (PRINZIDE,ZESTORETIC) 10-12.5 MG tablet, Take 1 tablet by mouth daily., Disp: 90 tablet, Rfl: 3 .  montelukast (SINGULAIR) 10 MG tablet, Take 1 tablet (10 mg total) by mouth at bedtime., Disp: 90 tablet, Rfl: 3 .  omeprazole (PRILOSEC) 20 MG capsule, Take 1 capsule (20 mg total) by mouth daily., Disp: 90 capsule, Rfl: 3 .  VENTOLIN HFA 108 (90 Base) MCG/ACT inhaler, TAKE 2 PUFFS BY MOUTH EVERY 6 HOURS AS NEEDED FOR WHEEZE Amanda [name] SHORTNESS OF BREATH, Disp: 18 g, Rfl: 3  EXAM:  VITALS per patient if applicable: RR between 12-20 bpm  GENERAL: alert, oriented, appears well and in no acute distress  HEENT: atraumatic, conjunctiva clear, no obvious abnormalities on inspection of external nose and ears  NECK: normal movements of the head and neck  LUNGS: on inspection no signs of respiratory distress, breathing rate appears normal, no obvious gross SOB, gasping Amanda [name] wheezing  CV: no obvious cyanosis  MS: moves all visible extremities without noticeable abnormality  PSYCH/NEURO: pleasant and cooperative, no obvious depression Amanda [name] anxiety, speech and thought processing grossly intact  ASSESSMENT AND PLAN:  Discussed the following assessment and plan:  Essential hypertension  -discussed changing ACE-I 2/2 concern for occasional cough after taking med.  Pt wishes to wait at this time.  If cough continues Amanda [name] becomes worse will d/c lisinopril.   -continue lifestyle modifications - Plan: Basic metabolic panel, lisinopril-hydrochlorothiazide (ZESTORETIC) 10-12.5 MG tablet  Weight gain  - Plan:  Hemoglobin A1c, TSH  Controlled type 2 diabetes mellitus without complication, without long-term current use of insulin (HCC)  -last hgb A1C 6.7% - Plan: Hemoglobin A1c  Gastroesophageal reflux disease, unspecified whether esophagitis present  -avoid foods known to cause problems - Plan: omeprazole (PRILOSEC) 20 MG capsule  Irritable bowel syndrome, unspecified type -continue probiotic -will restart Bentyl if needed  HLD -continue lifestyle modifications -lipid panel  Mild intermittent asthma without complication  - Plan: fluticasone (FLONASE) 50 MCG/ACT nasal spray, montelukast (SINGULAIR) 10 MG tablet  Pt to have labs done next wk.  Will f/u according to results.   I discussed the assessment and treatment plan with the patient. The patient was provided an opportunity to ask questions and all were answered. The patient agreed with the plan and demonstrated an understanding of the instructions.   The patient was advised to call back Amanda [name] seek an in-person evaluation if the symptoms worsen Amanda [name] if the condition fails to improve as anticipated.   Billie Ruddy, MD

## 2019-10-08 ENCOUNTER — Other Ambulatory Visit: Payer: 59

## 2019-10-08 ENCOUNTER — Other Ambulatory Visit: Payer: Self-pay

## 2019-10-08 LAB — LIPID PANEL
Cholesterol: 221 mg/dL — ABNORMAL HIGH (ref 0–200)
HDL: 51.4 mg/dL (ref >39.00)
LDL Cholesterol: 145 mg/dL — ABNORMAL HIGH (ref 0–99)
NonHDL: 169.33
Total CHOL/HDL Ratio: 4
Triglycerides: 121 mg/dL (ref 0.0–149.0)
VLDL: 24.2 mg/dL (ref 0.0–40.0)

## 2019-10-08 LAB — BASIC METABOLIC PANEL
BUN: 10 mg/dL (ref 6–23)
CO2: 24 mEq/L (ref 19–32)
Calcium: 9.7 mg/dL (ref 8.4–10.5)
Chloride: 105 mEq/L (ref 96–112)
Creatinine, Ser: 1.06 mg/dL (ref 0.40–1.20)
GFR: 65.48 mL/min (ref >60.00)
Glucose, Bld: 123 mg/dL — ABNORMAL HIGH (ref 70–99)
Potassium: 4 mEq/L (ref 3.5–5.1)
Sodium: 140 mEq/L (ref 135–145)

## 2019-10-08 LAB — HEMOGLOBIN A1C: Hgb A1c MFr Bld: 6.3 % (ref 4.6–6.5)

## 2019-10-09 LAB — TSH: TSH: 2.72 u[IU]/mL (ref 0.35–4.50)

## 2019-11-01 ENCOUNTER — Other Ambulatory Visit: Payer: Self-pay | Admitting: Family Medicine

## 2019-11-01 DIAGNOSIS — I10 Essential (primary) hypertension: Secondary | ICD-10-CM

## 2019-11-26 ENCOUNTER — Encounter: Payer: Self-pay | Admitting: Family Medicine

## 2019-11-28 ENCOUNTER — Other Ambulatory Visit: Payer: Self-pay | Admitting: Family Medicine

## 2019-11-28 MED ORDER — DICYCLOMINE HCL 20 MG PO TABS: 20.0000 mg | ORAL_TABLET | Freq: Four times a day (QID) | ORAL | 2 refills | Status: AC

## 2020-08-18 IMAGING — MG DIGITAL SCREENING BILATERAL MAMMOGRAM WITH TOMO AND CAD
6 of 10 series · 6 of 30 positions shown · non-contrast
Comparison: Previous exam(s).

ACR Breast Density Category a: The breast tissue is almost entirely
fatty.

CLINICAL DATA: Screening.

EXAM:
DIGITAL SCREENING BILATERAL MAMMOGRAM WITH TOMO AND CAD

[R CC synth-2D]
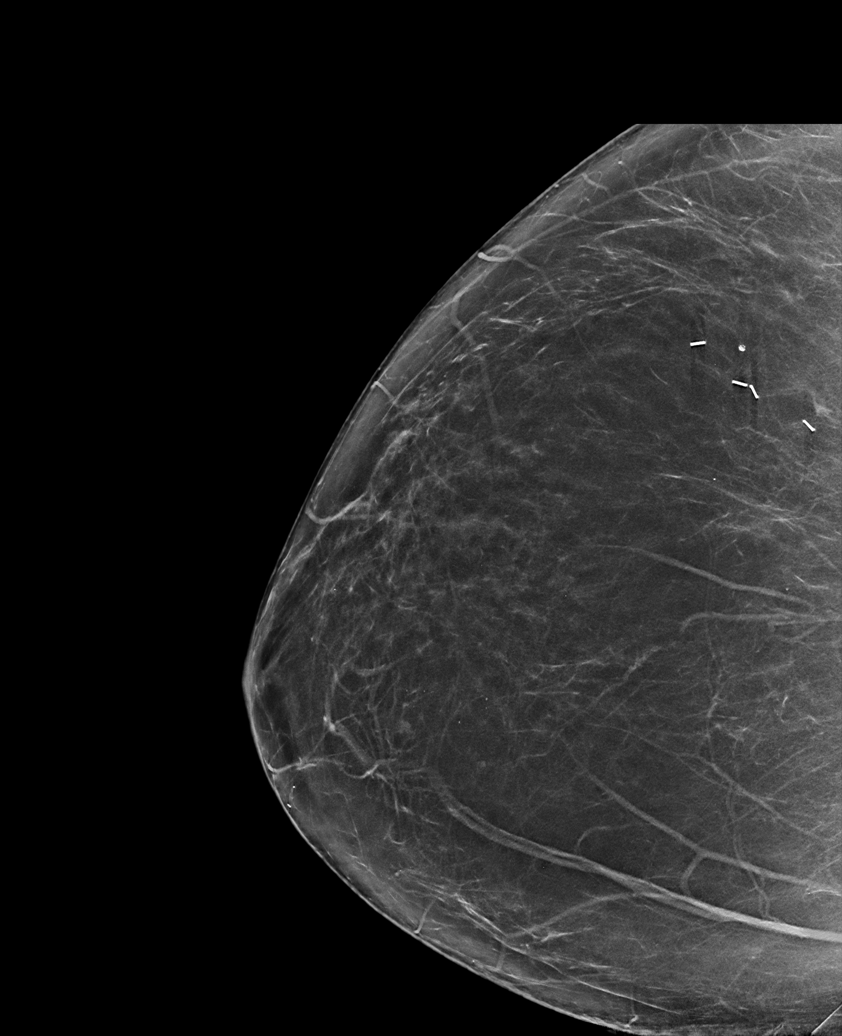

[L MLO synth-2D]
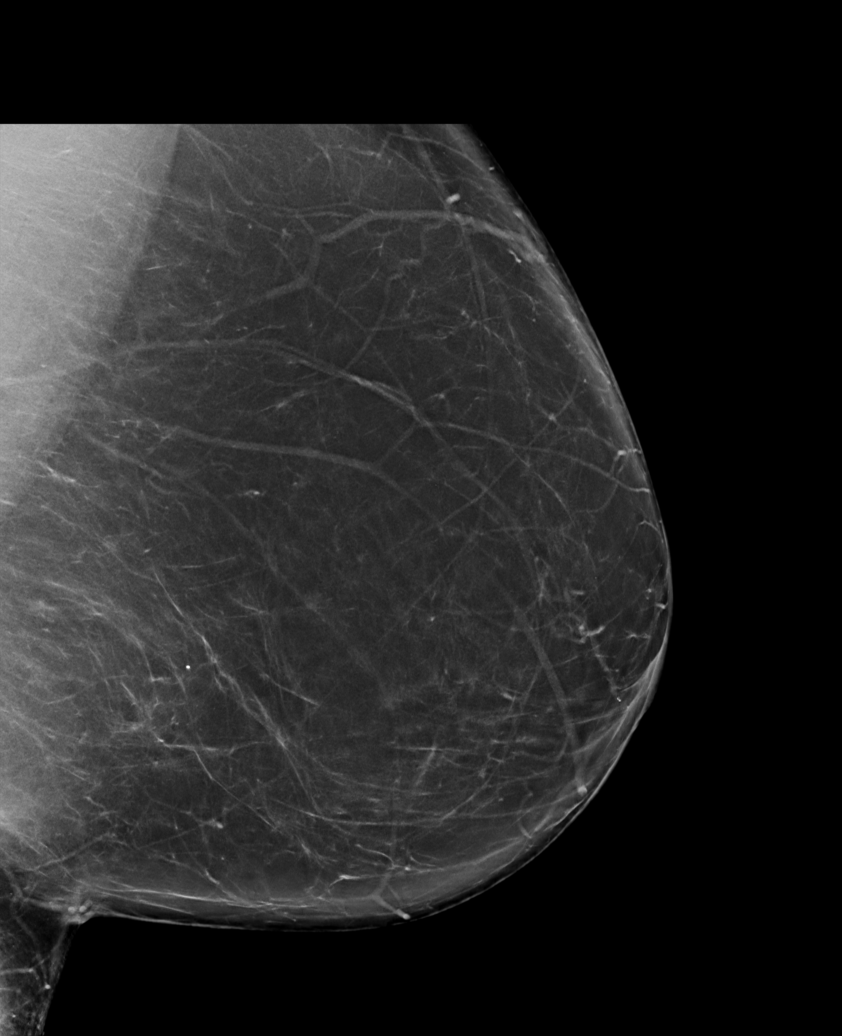

[R MLO synth-2D]
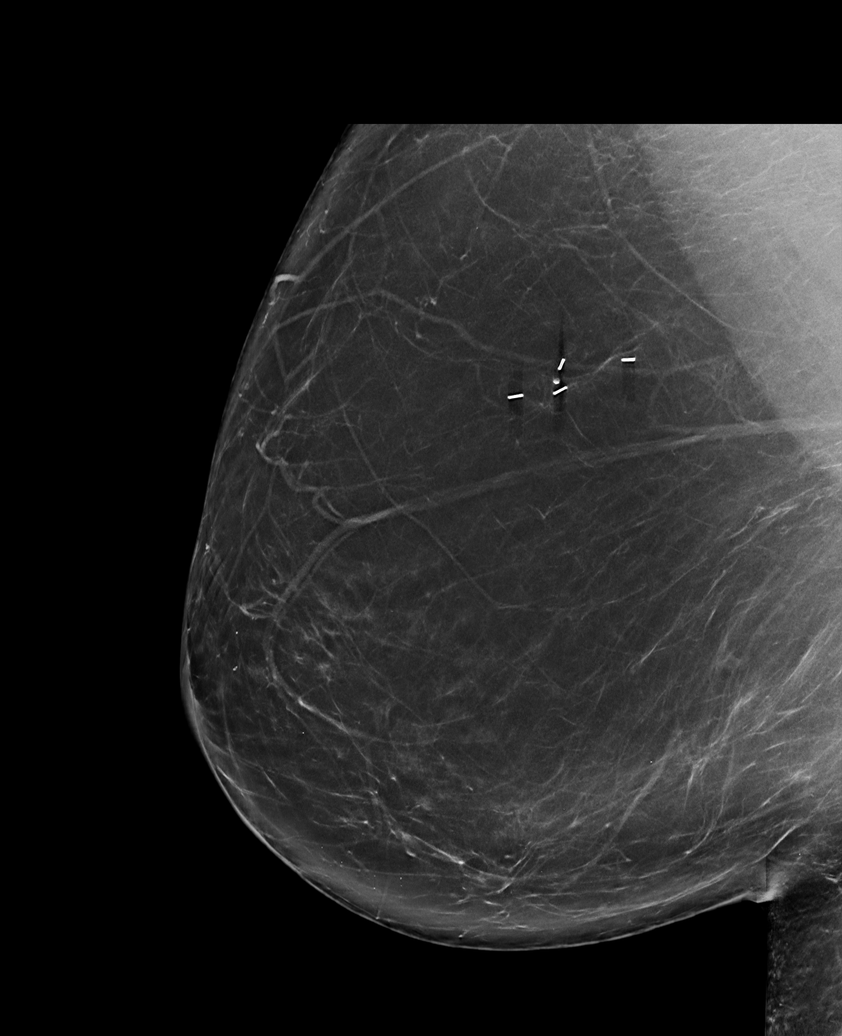

[R CV synth-2D]
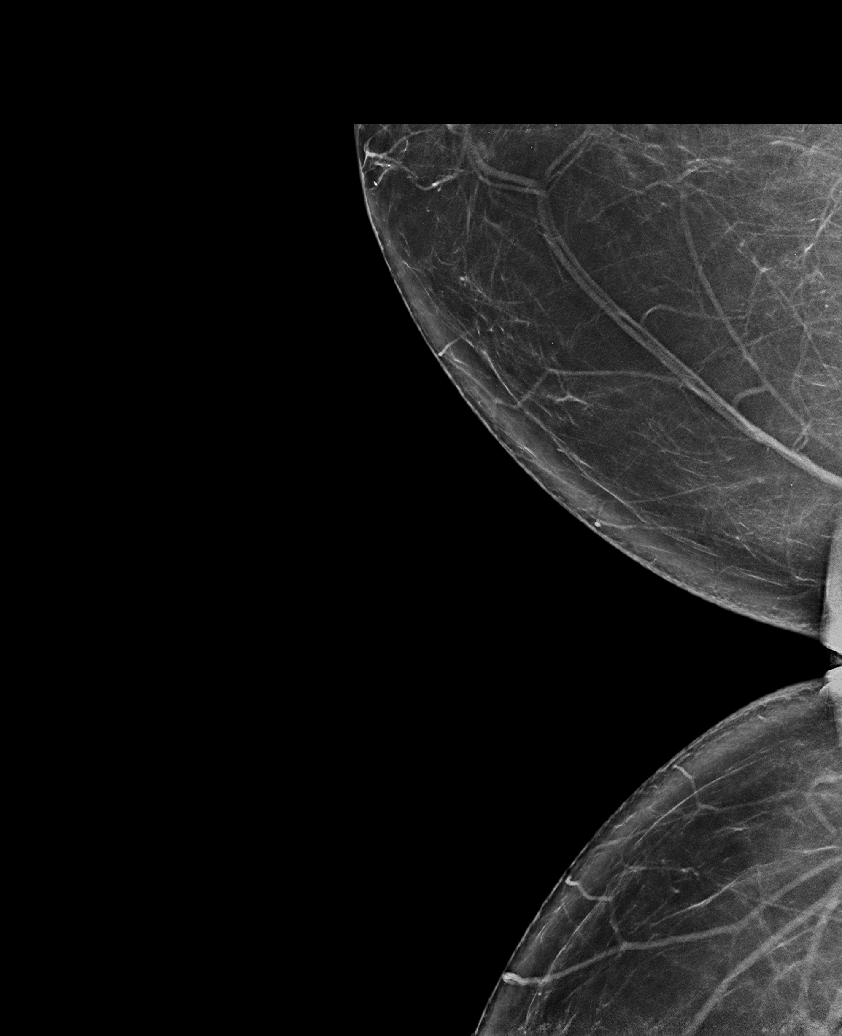

[L CC synth-2D]
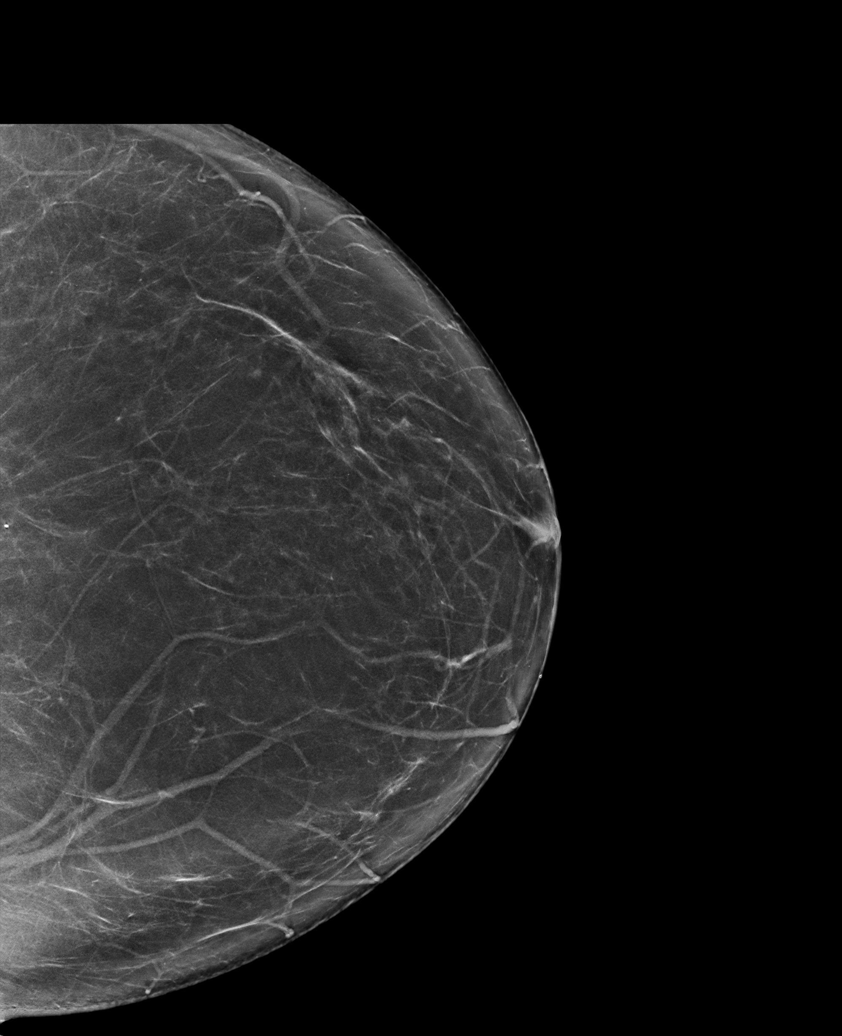

[L CC tomo · tomo slice 43/86.0]
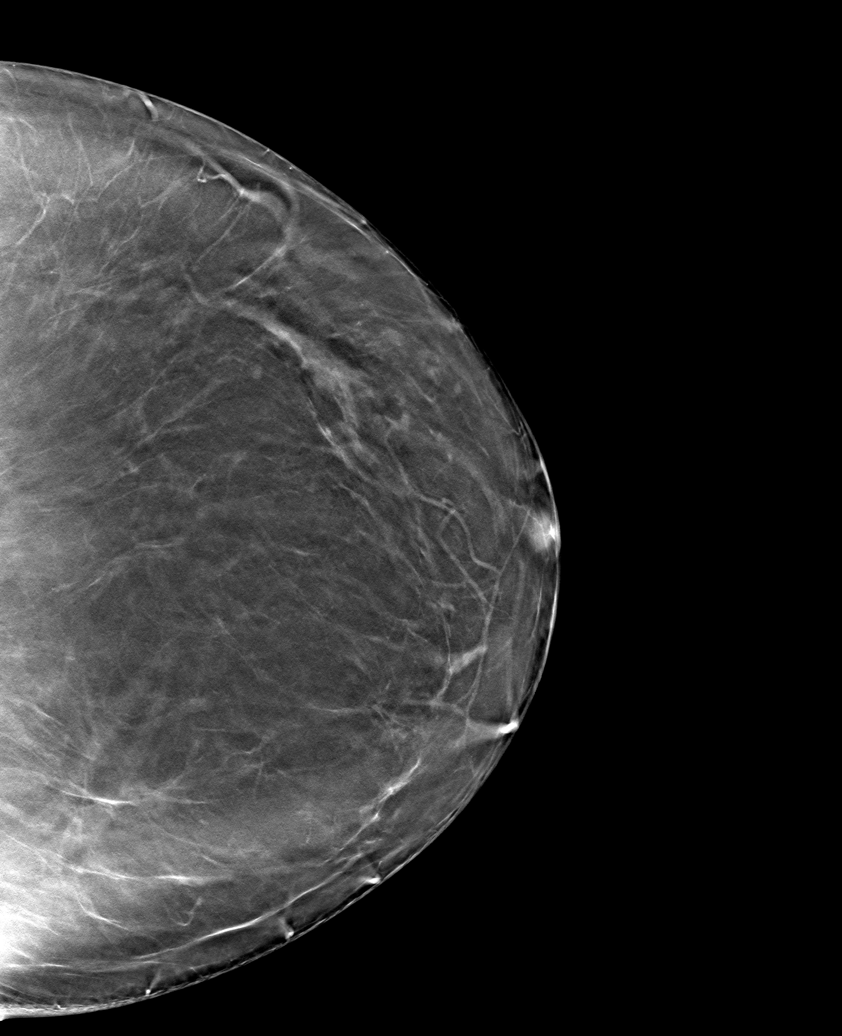

[6 of 30 positions shown; findings below may reference images not displayed]

FINDINGS: There are no findings suspicious for malignancy. Images were
processed with CAD.
IMPRESSION: No mammographic evidence of malignancy. A result letter of this
screening mammogram will be mailed directly to the patient.

RECOMMENDATION:
Screening mammogram in one year. (Code:[id number])

BI-RADS CATEGORY  1: Negative.

## 2020-10-28 ENCOUNTER — Other Ambulatory Visit: Payer: Self-pay | Admitting: Family Medicine

## 2020-10-28 DIAGNOSIS — I10 Essential (primary) hypertension: Secondary | ICD-10-CM

## 2020-10-28 NOTE — Telephone Encounter (Signed)
Spoke with pt state that she moved back to [place] and has a new PCP, Pt requested to be removed from Dr Salomon Fick list. PCP changed
